# Patient Record
Sex: Male | Born: 1985 | Race: White | Hispanic: Yes | Marital: Single | Smoking: Never smoker
Health system: Southern US, Community
[De-identification: ages and names within clinical notes are randomized; demographics above are authoritative.]

---

## 2006-06-04 ENCOUNTER — Ambulatory Visit: Payer: Self-pay | Admitting: Internal Medicine

## 2008-05-28 ENCOUNTER — Telehealth (INDEPENDENT_AMBULATORY_CARE_PROVIDER_SITE_OTHER): Payer: Self-pay | Admitting: *Deleted

## 2020-07-09 ENCOUNTER — Emergency Department (HOSPITAL_BASED_OUTPATIENT_CLINIC_OR_DEPARTMENT_OTHER): Payer: No Typology Code available for payment source

## 2020-07-09 ENCOUNTER — Encounter (HOSPITAL_BASED_OUTPATIENT_CLINIC_OR_DEPARTMENT_OTHER): Payer: Self-pay

## 2020-07-09 ENCOUNTER — Emergency Department (HOSPITAL_BASED_OUTPATIENT_CLINIC_OR_DEPARTMENT_OTHER)
Admission: EM | Admit: 2020-07-09 | Discharge: 2020-07-10 | Disposition: A | Discharge status: Home / Self Care | Payer: No Typology Code available for payment source | Attending: Emergency Medicine | Admitting: Emergency Medicine

## 2020-07-09 ENCOUNTER — Other Ambulatory Visit: Payer: Self-pay

## 2020-07-09 DIAGNOSIS — Y999 Unspecified external cause status: Secondary | ICD-10-CM | POA: Insufficient documentation

## 2020-07-09 DIAGNOSIS — X19XXXA Contact with other heat and hot substances, initial encounter: Secondary | ICD-10-CM | POA: Insufficient documentation

## 2020-07-09 DIAGNOSIS — Y929 Unspecified place or not applicable: Secondary | ICD-10-CM | POA: Diagnosis not present

## 2020-07-09 DIAGNOSIS — T22112A Burn of first degree of left forearm, initial encounter: Secondary | ICD-10-CM | POA: Insufficient documentation

## 2020-07-09 DIAGNOSIS — S63621A Sprain of interphalangeal joint of right thumb, initial encounter: Secondary | ICD-10-CM | POA: Insufficient documentation

## 2020-07-09 DIAGNOSIS — Z23 Encounter for immunization: Secondary | ICD-10-CM | POA: Diagnosis not present

## 2020-07-09 DIAGNOSIS — T22212A Burn of second degree of left forearm, initial encounter: Secondary | ICD-10-CM | POA: Diagnosis not present

## 2020-07-09 DIAGNOSIS — S60011A Contusion of right thumb without damage to nail, initial encounter: Secondary | ICD-10-CM | POA: Insufficient documentation

## 2020-07-09 DIAGNOSIS — W2211XA Striking against or struck by driver side automobile airbag, initial encounter: Secondary | ICD-10-CM | POA: Diagnosis not present

## 2020-07-09 DIAGNOSIS — Y939 Activity, unspecified: Secondary | ICD-10-CM | POA: Insufficient documentation

## 2020-07-09 MED ORDER — TETANUS-DIPHTH-ACELL PERTUSSIS 5-2.5-18.5 LF-MCG/0.5 IM SUSP
0.5000 mL | Freq: Once | INTRAMUSCULAR | Status: AC
  Administered 2020-07-09: 0.5 mL via INTRAMUSCULAR
  Filled 2020-07-09: qty 0.5

## 2020-07-09 MED ORDER — BACITRACIN ZINC 500 UNIT/GM EX OINT
TOPICAL_OINTMENT | Freq: Once | CUTANEOUS | Status: AC
  Administered 2020-07-09: 1 via TOPICAL
  Filled 2020-07-09: qty 28.35

## 2020-07-09 NOTE — ED Notes (Signed)
No answer for treatment room x1 
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
## 2020-07-10 MED ORDER — TRIPLE ANTIBIOTIC 5-400-5000 EX OINT: TOPICAL_OINTMENT | CUTANEOUS | 0 refills | Status: DC

## 2020-07-10 MED ORDER — TRIPLE ANTIBIOTIC 5-400-5000 EX OINT: TOPICAL_OINTMENT | CUTANEOUS | 0 refills | Status: AC

## 2020-07-10 MED ORDER — NAPROXEN 375 MG PO TABS: ORAL_TABLET | ORAL | 0 refills | Status: AC

## 2021-02-08 ENCOUNTER — Emergency Department (HOSPITAL_BASED_OUTPATIENT_CLINIC_OR_DEPARTMENT_OTHER)
Admission: EM | Admit: 2021-02-08 | Discharge: 2021-02-08 | Disposition: A | Discharge status: Home / Self Care | Payer: Self-pay | Attending: Emergency Medicine | Admitting: Emergency Medicine

## 2021-02-08 ENCOUNTER — Other Ambulatory Visit: Payer: Self-pay

## 2021-02-08 ENCOUNTER — Encounter (HOSPITAL_BASED_OUTPATIENT_CLINIC_OR_DEPARTMENT_OTHER): Payer: Self-pay | Admitting: *Deleted

## 2021-02-08 DIAGNOSIS — R059 Cough, unspecified: Secondary | ICD-10-CM

## 2021-02-08 DIAGNOSIS — Z20822 Contact with and (suspected) exposure to covid-19: Secondary | ICD-10-CM | POA: Insufficient documentation

## 2021-02-08 DIAGNOSIS — J069 Acute upper respiratory infection, unspecified: Secondary | ICD-10-CM | POA: Insufficient documentation

## 2021-02-08 DIAGNOSIS — Z28311 Partially vaccinated for covid-19: Secondary | ICD-10-CM | POA: Insufficient documentation

## 2021-02-08 DIAGNOSIS — J0110 Acute frontal sinusitis, unspecified: Secondary | ICD-10-CM | POA: Insufficient documentation

## 2021-02-08 MED ORDER — ACETAMINOPHEN 325 MG PO TABS
650.0000 mg | ORAL_TABLET | Freq: Once | ORAL | Status: AC
  Administered 2021-02-08: 650 mg via ORAL
  Filled 2021-02-08: qty 2

## 2021-02-08 MED ORDER — BENZONATATE 100 MG PO CAPS: 100.0000 mg | ORAL_CAPSULE | Freq: Three times a day (TID) | ORAL | 0 refills | Status: AC

## 2021-02-08 MED ORDER — AMOXICILLIN 500 MG PO CAPS: 500.0000 mg | ORAL_CAPSULE | Freq: Three times a day (TID) | ORAL | 0 refills | Status: AC

## 2021-02-08 NOTE — ED Triage Notes (Signed)
C/o URi symptoms with fever x 3 days

## 2021-02-08 NOTE — Discharge Instructions (Addendum)
As discussed, your Covid test is pending.  Continue to self quarantine until your results become available.  I am sending you home with cough medication.  Take as needed.  Continue to take over-the-counter ibuprofen or Tylenol as needed for fever.  I am also sending you home with antibiotics. Do not take antibiotics unless your sinus pressure does not improve within the next week. Return to the ER for new or worsening symptoms.

## 2021-02-08 NOTE — ED Provider Notes (Signed)
[facility] EMERGENCY DEPARTMENT Provider Note   CSN: [id number] Arrival date & time: 02/08/21  1341     History Chief Complaint  Patient presents with  . URI  . Fever    Jeff Gay is a 35 y.o. male with no significant past medical history presents to the ED due to dry cough, sinus pressure, sneezing, and fever x2 days.  Patient states he started to develop a fever earlier this morning.  Denies sick contacts known Covid exposures.  Denies associated chest pain, shortness of breath, abdominal pain, nausea, vomiting, diarrhea.  No urinary symptoms. Patient notes he has a history of sinusitis. He received his [employer] COVID vaccine however, not his booster shot.  He is taking DayQuil with moderate relief in symptoms. No aggravating factors.  History obtained from patient and past medical records. No interpreter used during encounter.      History reviewed. No pertinent past medical history.  There are no problems to display for this patient.   History reviewed. No pertinent surgical history.     No family history on file.  Social History   Tobacco Use  . Smoking status: Never Smoker  . Smokeless tobacco: Never Used  Vaping Use  . Vaping Use: Never used  Substance Use Topics  . Alcohol use: Never  . Drug use: Never    Home Medications Prior to Admission medications   Medication Sig Start Date End Date Taking? Authorizing Provider  amoxicillin (AMOXIL) 500 MG capsule Take 1 capsule (500 mg total) by mouth 3 (three) times daily for 7 days. 02/08/21 02/15/21 Yes Shayann Garbutt, Merla Riches, PA-C  benzonatate (TESSALON) 100 MG capsule Take 1 capsule (100 mg total) by mouth every 8 (eight) hours. 02/08/21  Yes Kameelah Minish C, PA-C  naproxen (NAPROSYN) 375 MG tablet Take 1 tablet twice daily as needed for pain. 07/10/20   Molpus, John, MD  neomycin-bacitracin-polymyxin (NEOSPORIN) [phone] ointment Applied to burn at least twice daily and change bandage. 07/10/20    Molpus, Jonny Ruiz, MD    Allergies    Patient has no known allergies.  Review of Systems   Review of Systems  Constitutional: Positive for fever. Negative for chills.  HENT: Positive for sinus pressure and sinus pain. Negative for sore throat.   Respiratory: Positive for cough. Negative for shortness of breath.   Cardiovascular: Negative for chest pain.  Gastrointestinal: Negative for abdominal pain, diarrhea, nausea and vomiting.  Genitourinary: Negative for dysuria.    Physical Exam Updated Vital Signs BP (!) 166/101 (BP Location: Right Arm)   Pulse (!) 113   Temp (!) 100.8 F (38.2 C) (Oral)   Resp 20   Ht 5\' 5"  (1.651 m)   Wt 63.5 kg   SpO2 100%   BMI 23.30 kg/m   Physical Exam Vitals and nursing note reviewed.  Constitutional:      General: He is not in acute distress.    Appearance: He is not ill-appearing.  HENT:     Head: Normocephalic.     Comments: Tenderness throughout frontal and ethmoid sinuses.     Mouth/Throat:     Comments: Posterior oropharynx clear and mucous membranes moist, there is mild erythema but no edema or tonsillar exudates, uvula midline, normal phonation, no trismus, tolerating secretions without difficulty. Eyes:     Pupils: Pupils are equal, round, and reactive to light.  Cardiovascular:     Rate and Rhythm: Normal rate and regular rhythm.     Pulses: Normal pulses.  Heart sounds: Normal heart sounds. No murmur heard. No friction rub. No gallop.   Pulmonary:     Effort: Pulmonary effort is normal.     Breath sounds: Normal breath sounds.     Comments: Respirations equal and unlabored, patient able to speak in full sentences, lungs clear to auscultation bilaterally Abdominal:     General: Abdomen is flat. Bowel sounds are normal. There is no distension.     Palpations: Abdomen is soft.     Tenderness: There is no abdominal tenderness. There is no guarding or rebound.     Comments: Abdomen soft, nondistended, nontender to palpation in  all quadrants without guarding or peritoneal signs. No rebound.   Musculoskeletal:        General: Normal range of motion.     Cervical back: Neck supple.  Skin:    General: Skin is warm and dry.  Neurological:     General: No focal deficit present.     Mental Status: He is alert.  Psychiatric:        Mood and Affect: Mood normal.        Behavior: Behavior normal.     ED Results / Procedures / Treatments   Labs (all labs ordered are listed, but only abnormal results are displayed) Labs Reviewed  SARS CORONAVIRUS 2 (TAT 6-24 HRS)    EKG None  Radiology No results found.  Procedures Procedures   Medications Ordered in ED Medications  acetaminophen (TYLENOL) tablet 650 mg (650 mg Oral Given 02/08/21 1417)    ED Course  I have reviewed the triage vital signs and the nursing notes.  Pertinent labs & imaging results that were available during my care of the patient were reviewed by me and considered in my medical decision making (see chart for details).  Clinical Course as of 02/08/21 1423  Wed Feb 08, 2021  1359 Pulse Rate(!): 113 [CA]  1359 Temp(!): 100.8 F (38.2 C) [CA]    Clinical Course User Index [CA] Mannie Stabile, PA-C   MDM Rules/Calculators/A&P                         35 year old male presents to the ED due to URI symptoms with fever x3 days. No sick contacts and known COVID exposures. He has received his [employer] vaccine; however, no booster.  No chest pain or shortness of breath.  Upon arrival, patient febrile, tachycardic with mildly elevated BP.  Normal O2 saturation.  Patient no acute distress and nonseptic appearing.  Physical exam reassuring.  Lungs clear to auscultation bilaterally.  Abdomen soft, nondistended, nontender.  No meningismus to suggest meningitis.  Tenderness throughout frontal and ethmoid sinuses. Low suspicion for bacterial infection. Suspect viral etiology. COVID test pending. Tylenol given for fever. Suspect tachycardia  related to fever. No chest pain or shortness of breath to suggest PE/DVT. Patient discharged with cough medication and antibiotics for sinusitis. Advised patient to hold antibiotics for 1 week and take if symptoms do not improve. Strict ED precautions discussed with patient. Patient states understanding and agrees to plan. Patient discharged home in no acute distress and stable vitals  Jeff Gay was evaluated in Emergency Department on 02/08/2021 for the symptoms described in the history of present illness. He was evaluated in the context of the global COVID-19 pandemic, which necessitated consideration that the patient might be at risk for infection with the SARS-CoV-2 virus that causes COVID-19. Institutional protocols and algorithms that pertain to the  evaluation of patients at risk for COVID-19 are in a state of rapid change based on information released by regulatory bodies including the CDC and federal and state organizations. These policies and algorithms were followed during the patient's care in the ED.  Final Clinical Impression(s) / ED Diagnoses Final diagnoses:  Upper respiratory tract infection, unspecified type  Cough  Acute non-recurrent frontal sinusitis    Rx / DC Orders ED Discharge Orders         Ordered    benzonatate (TESSALON) 100 MG capsule  Every 8 hours        02/08/21 1422    amoxicillin (AMOXIL) 500 MG capsule  3 times daily        02/08/21 1422           Jesusita Oka 02/08/21 1423    Linwood Dibbles, MD 02/09/21 1541

## 2021-02-09 LAB — SARS CORONAVIRUS 2 (TAT 6-24 HRS): SARS Coronavirus 2: NEGATIVE
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
